# Patient Record
Sex: Female | Born: 1980 | Race: White | Hispanic: No | Marital: Married | Smoking: Never smoker
Health system: Southern US, Community
[De-identification: ages and names within clinical notes are randomized; demographics above are authoritative.]

## PROBLEM LIST (undated history)

## (undated) DIAGNOSIS — J45909 Unspecified asthma, uncomplicated: Secondary | ICD-10-CM

## (undated) DIAGNOSIS — M6289 Other specified disorders of muscle: Secondary | ICD-10-CM

## (undated) DIAGNOSIS — A609 Anogenital herpesviral infection, unspecified: Secondary | ICD-10-CM

## (undated) DIAGNOSIS — U071 COVID-19: Secondary | ICD-10-CM

## (undated) DIAGNOSIS — Z1589 Genetic susceptibility to other disease: Secondary | ICD-10-CM

## (undated) DIAGNOSIS — K5909 Other constipation: Secondary | ICD-10-CM

## (undated) HISTORY — PX: WISDOM TOOTH EXTRACTION: SHX21

## (undated) HISTORY — PX: REFRACTIVE SURGERY: SHX103

---

## 2013-02-01 ENCOUNTER — Other Ambulatory Visit: Payer: Self-pay | Admitting: Orthopedic Surgery

## 2013-02-01 DIAGNOSIS — M25561 Pain in right knee: Secondary | ICD-10-CM

## 2013-02-08 ENCOUNTER — Other Ambulatory Visit: Payer: Self-pay

## 2020-06-22 ENCOUNTER — Emergency Department (INDEPENDENT_AMBULATORY_CARE_PROVIDER_SITE_OTHER)
Admission: EM | Admit: 2020-06-22 | Discharge: 2020-06-22 | Disposition: A | Discharge status: Home / Self Care | Payer: PRIVATE HEALTH INSURANCE | Source: Home / Self Care

## 2020-06-22 ENCOUNTER — Emergency Department (INDEPENDENT_AMBULATORY_CARE_PROVIDER_SITE_OTHER): Payer: PRIVATE HEALTH INSURANCE

## 2020-06-22 ENCOUNTER — Other Ambulatory Visit: Payer: Self-pay

## 2020-06-22 DIAGNOSIS — J069 Acute upper respiratory infection, unspecified: Secondary | ICD-10-CM

## 2020-06-22 DIAGNOSIS — R059 Cough, unspecified: Secondary | ICD-10-CM

## 2020-06-22 HISTORY — DX: Unspecified asthma, uncomplicated: J45.909

## 2020-06-22 HISTORY — DX: Other constipation: K59.09

## 2020-06-22 HISTORY — DX: Anogenital herpesviral infection, unspecified: A60.9

## 2020-06-22 HISTORY — DX: COVID-19: U07.1

## 2020-06-22 HISTORY — DX: Other specified disorders of muscle: M62.89

## 2020-06-22 HISTORY — DX: Genetic susceptibility to other disease: Z15.89

## 2020-06-22 MED ORDER — PREDNISONE 10 MG PO TABS: 20.0000 mg | ORAL_TABLET | Freq: Every day | ORAL | 0 refills | Status: AC

## 2020-06-22 NOTE — ED Triage Notes (Signed)
Pt presents to Urgent Care with c/o cough x 3 weeks. Has had two negative home COVID tests. She is now having pain in her "ribs," bilaterally and across her back d/t coughing.

## 2020-06-22 NOTE — ED Provider Notes (Signed)
[facility]    CSN: [id number] Arrival date & time: 06/22/20  1010      History   Chief Complaint Chief Complaint  Patient presents with  . Cough  . Pleurisy    HPI Sarah Newton is a 40 y.o. female.   3-week history of cough.  Now has some soreness in her ribs sounds more like costochondritis than pleurisy.  Cough is generally been nonproductive.  Others have had similar symptoms in the home.  Has had negative home COVID test x2  HPI  Past Medical History:  Diagnosis Date  . Asthma   . Chronic constipation   . COVID   . HSV (herpes simplex virus) anogenital infection   . MTHFR gene mutation   . Pelvic floor dysfunction   . Reactive airway disease     There are no problems to display for this patient.   Past Surgical History:  Procedure Laterality Date  . REFRACTIVE SURGERY    . WISDOM TOOTH EXTRACTION      OB History   No obstetric history on file.      Home Medications    Prior to Admission medications   Medication Sig Start Date End Date Taking? Authorizing Provider  acetaminophen (TYLENOL) 325 MG tablet Take 650 mg by mouth every 6 (six) hours as needed.   Yes [provider]  dextromethorphan-guaiFENesin (MUCINEX DM) 30-600 MG 12hr tablet Take 1 tablet by mouth 2 (two) times daily.   Yes [provider]  etonogestrel-ethinyl estradiol (NUVARING) 0.12-0.015 MG/24HR vaginal ring  11/30/19  Yes [provider]  ibuprofen (ADVIL) 400 MG tablet Take 400 mg by mouth every 6 (six) hours as needed.   Yes [provider]  phenylephrine (SUDAFED PE) 10 MG TABS tablet Take 10 mg by mouth every 4 (four) hours as needed.   Yes [provider]  valACYclovir (VALTREX) 500 MG tablet Take by mouth. 03/06/20  Yes [provider]  cyclobenzaprine (FLEXERIL) 10 MG tablet Take 1 tablet by mouth 3 (three) times daily. 03/14/20   [provider]    Family History Family History  Problem Relation Age of  Onset  . COPD Mother   . Hypertension Mother   . Stroke Mother   . Depression Mother   . Depression Father   . Hypertension Father   . Hyperlipidemia Father     Social History Social History   Tobacco Use  . Smoking status: Never Smoker  . Smokeless tobacco: Never Used  Vaping Use  . Vaping Use: Never used  Substance Use Topics  . Alcohol use: Yes    Comment: rarely  . Drug use: Not Currently     Allergies   Patient has no known allergies.   Review of Systems Review of Systems  Respiratory: Positive for cough.   Cardiovascular: Positive for chest pain.  All other systems reviewed and are negative.    Physical Exam Triage Vital Signs ED Triage Vitals  Enc Vitals Group     BP 06/22/20 1046 129/86     Pulse Rate 06/22/20 1046 91     Resp 06/22/20 1046 20     Temp 06/22/20 1046 98.6 F (37 C)     Temp Source 06/22/20 1046 Oral     SpO2 06/22/20 1046 96 %     Weight 06/22/20 1038 160 lb (72.6 kg)     Height 06/22/20 1038 5\' 7"  (1.702 m)     Head Circumference --  Peak Flow --      Pain Score 06/22/20 1037 3     Pain Loc --      Pain Edu? --      Excl. in GC? --    No data found.  Updated Vital Signs BP 129/86 (BP Location: Right Arm)   Pulse 91   Temp 98.6 F (37 C) (Oral)   Resp 20   Ht 5\' 7"  (1.702 m)   Wt 72.6 kg   LMP 06/05/2020 Comment: Nuvaring  SpO2 96%   BMI 25.06 kg/m   Visual Acuity Right Eye Distance:   Left Eye Distance:   Bilateral Distance:    Right Eye Near:   Left Eye Near:    Bilateral Near:     Physical Exam Vitals and nursing note reviewed.  Constitutional:      Appearance: Normal appearance.  HENT:     Head: Normocephalic.     Left Ear: Tympanic membrane normal.     Mouth/Throat:     Mouth: Mucous membranes are moist.  Cardiovascular:     Rate and Rhythm: Normal rate and regular rhythm.  Pulmonary:     Effort: Pulmonary effort is normal.     Breath sounds: Normal breath sounds.  Neurological:      General: No focal deficit present.     Mental Status: She is alert and oriented to person, place, and time.      UC Treatments / Results  Labs (all labs ordered are listed, but only abnormal results are displayed) Labs Reviewed - No data to display  EKG   Radiology DG Chest 2 View  Result Date: 06/22/2020 CLINICAL DATA:  Cough for 2 weeks EXAM: CHEST - 2 VIEW COMPARISON:  None. FINDINGS: Lungs are clear. Heart size and pulmonary vascularity are normal. No adenopathy. No bone lesions. IMPRESSION: Lungs clear.  Cardiac silhouette normal. Electronically Signed   By: 08/22/2020 III M.D.   On: 06/22/2020 11:22    Procedures Procedures (including critical care time)  Medications Ordered in UC Medications - No data to display  Initial Impression / Assessment and Plan / UC Course  I have reviewed the triage vital signs and the nursing notes.  Pertinent labs & imaging results that were available during my care of the patient were reviewed by me and considered in my medical decision making (see chart for details).     Viral bronchitis and reactive airways Final Clinical Impressions(s) / UC Diagnoses   Final diagnoses:  None   Discharge Instructions   None    ED Prescriptions    None     PDMP not reviewed this encounter.   08/22/2020, MD 06/22/20 1145

## 2020-06-22 NOTE — Discharge Instructions (Addendum)
Continue to use albuterol as we discussed

## 2023-01-27 IMAGING — DX DG CHEST 2V
2 series · 2 of 2 positions shown · non-contrast
Comparison: None.

CLINICAL DATA: Cough for 2 weeks

EXAM:
CHEST - 2 VIEW

[chest pa]
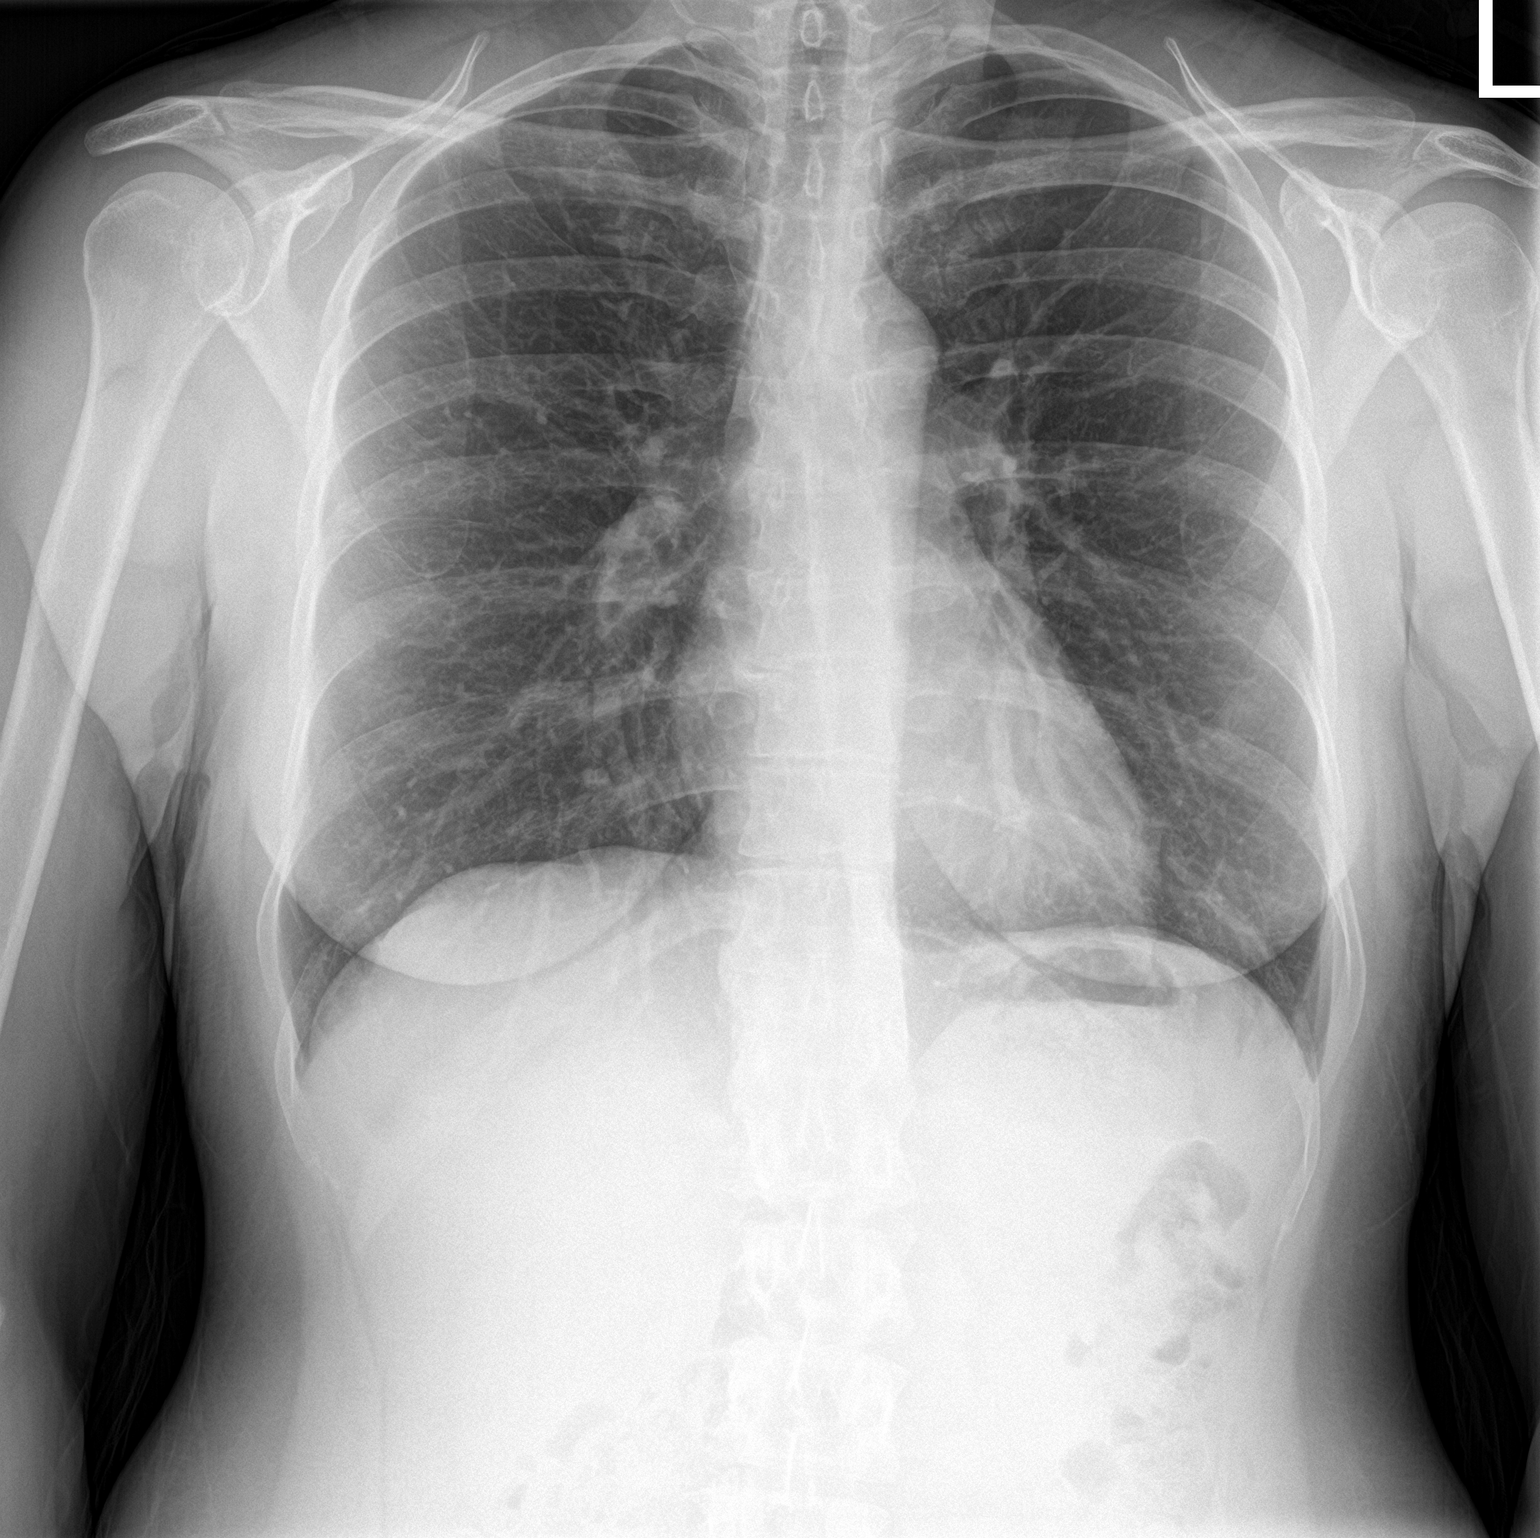

[chest lat]
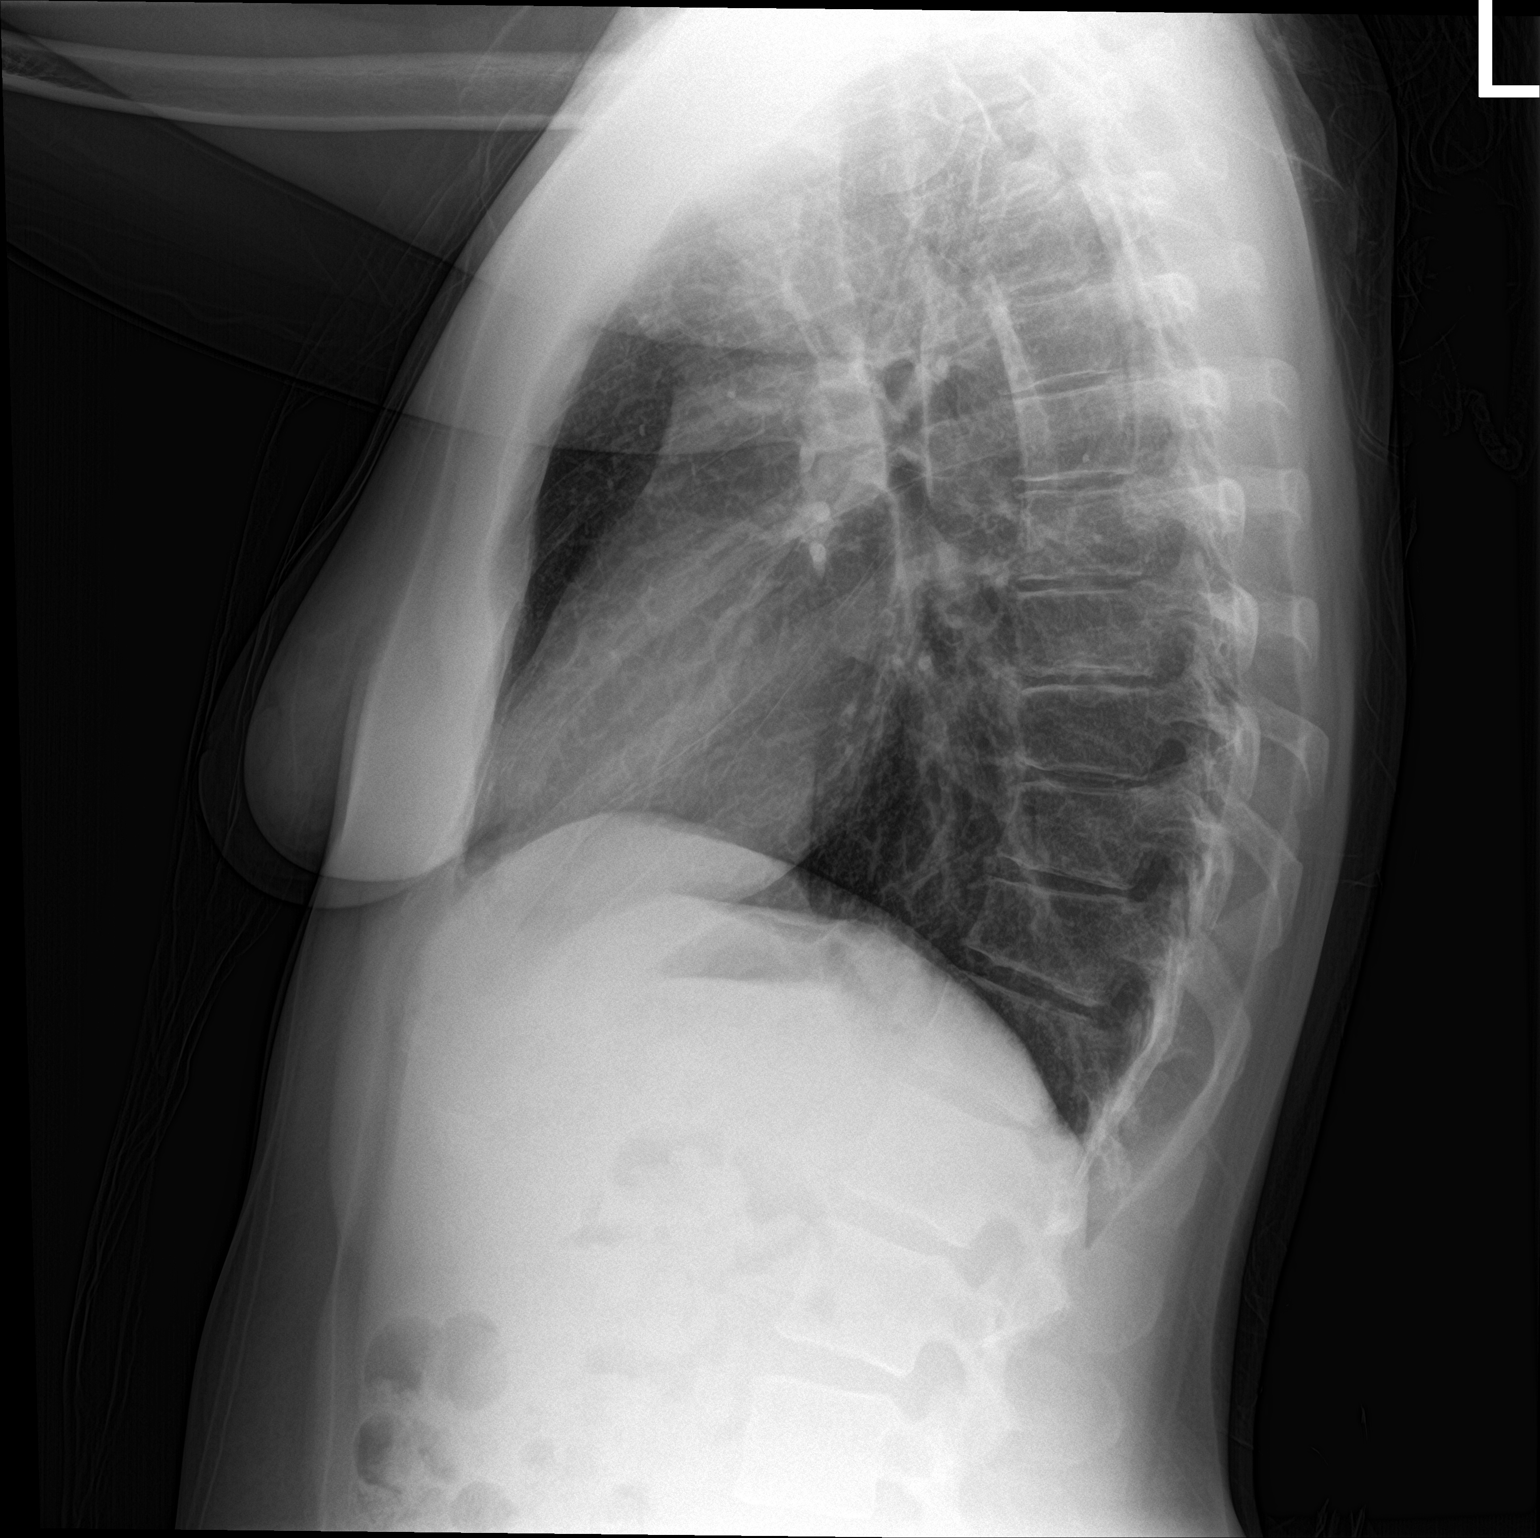

[2 of 2 positions shown; findings below may reference images not displayed]

FINDINGS: Lungs are clear. Heart size and pulmonary vascularity are normal. No
adenopathy. No bone lesions.
IMPRESSION: Lungs clear.  Cardiac silhouette normal.
